# Patient Record
Sex: Female | Born: 2005 | Race: White | Hispanic: No | Marital: Single | Smoking: Never smoker
Health system: Southern US, Community
[De-identification: ages and names within clinical notes are randomized; demographics above are authoritative.]

## PROBLEM LIST (undated history)

## (undated) DIAGNOSIS — H539 Unspecified visual disturbance: Secondary | ICD-10-CM

## (undated) HISTORY — PX: CHALAZION EXCISION: SHX213

---
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
## 2025-02-14 ENCOUNTER — Encounter: Admitting: Family Medicine
